# Patient Record
Sex: Male | Born: 1965 | Hispanic: No | Marital: Married
Health system: Southern US, Community
[De-identification: ages and names within clinical notes are randomized; demographics above are authoritative.]

---

## 2014-02-26 ENCOUNTER — Other Ambulatory Visit: Payer: Self-pay | Admitting: *Deleted

## 2014-02-26 ENCOUNTER — Ambulatory Visit
Admission: RE | Admit: 2014-02-26 | Discharge: 2014-02-26 | Disposition: A | Discharge status: Home / Self Care | Payer: No Typology Code available for payment source | Source: Ambulatory Visit | Attending: *Deleted | Admitting: *Deleted

## 2014-02-26 DIAGNOSIS — IMO0001 Reserved for inherently not codable concepts without codable children: Secondary | ICD-10-CM

## 2015-06-14 IMAGING — CR DG CHEST 1V
1 series · 1 of 1 positions shown · non-contrast
Comparison: None.

CLINICAL DATA: Positive PPD ; known history of splenic granuloma

EXAM:
CHEST - 1 VIEW

[view not recorded]
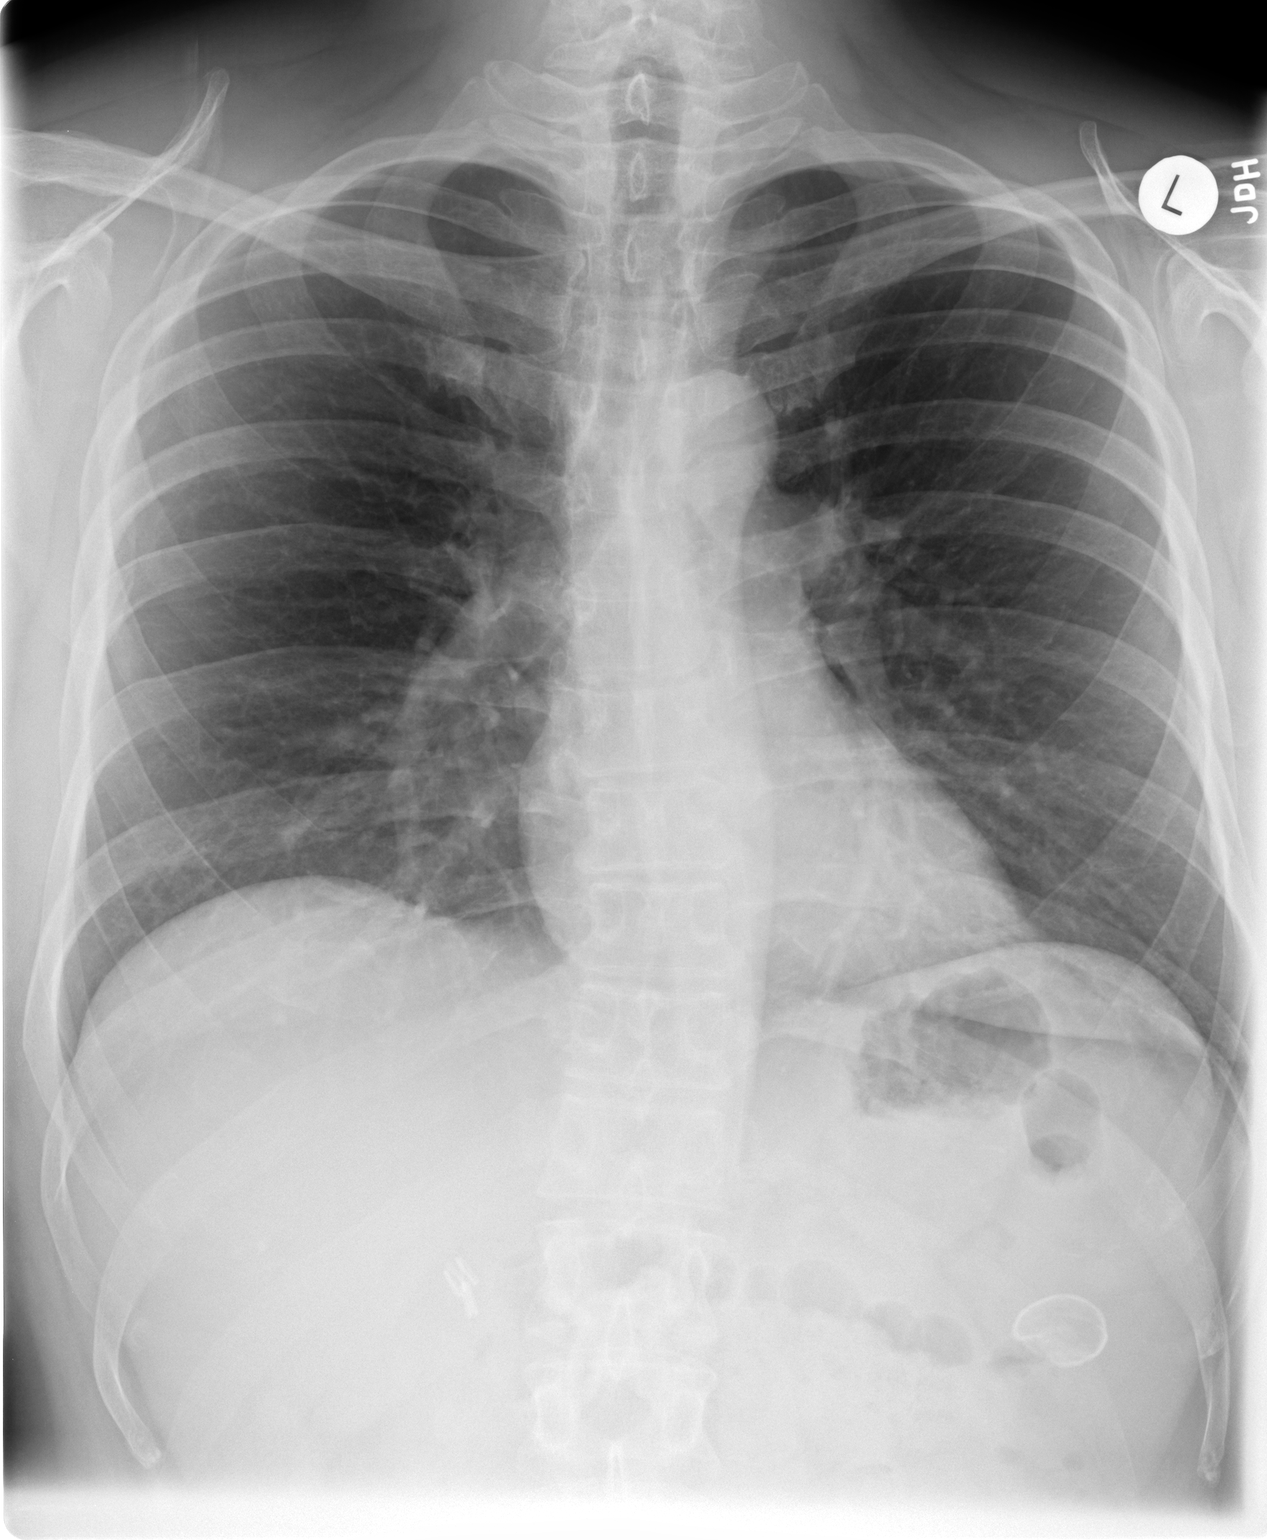

[1 of 1 positions shown; findings below may reference images not displayed]

FINDINGS: The lungs are well-expanded and clear. The heart and mediastinal
structures are normal. There is no pleural effusion. There is gentle
S-shaped thoracic scoliosis. The vertebral bodies are grossly
intact. There surgical clips in the gallbladder fossa. The known
splenic granuloma is visible in the left upper quadrant of the
abdomen. It measures 2.6 x 1.9 cm.
IMPRESSION: 1. There is no evidence of acute tuberculous infection of the
thorax.
2. A stable (by history) splenic granuloma is present.
